# Patient Record
Sex: Male | Born: 2000 | Race: White | Hispanic: No | Marital: Single | Smoking: Never smoker
Health system: Southern US, Community
[De-identification: ages and names within clinical notes are randomized; demographics above are authoritative.]

## PROBLEM LIST (undated history)

## (undated) DIAGNOSIS — J302 Other seasonal allergic rhinitis: Secondary | ICD-10-CM

---
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
## 2022-01-03 DIAGNOSIS — J301 Allergic rhinitis due to pollen: Secondary | ICD-10-CM | POA: Diagnosis not present

## 2022-01-03 DIAGNOSIS — H1045 Other chronic allergic conjunctivitis: Secondary | ICD-10-CM | POA: Diagnosis not present

## 2022-01-03 DIAGNOSIS — J3081 Allergic rhinitis due to animal (cat) (dog) hair and dander: Secondary | ICD-10-CM | POA: Diagnosis not present

## 2022-01-03 DIAGNOSIS — J3089 Other allergic rhinitis: Secondary | ICD-10-CM | POA: Diagnosis not present

## 2022-06-14 DIAGNOSIS — M25612 Stiffness of left shoulder, not elsewhere classified: Secondary | ICD-10-CM | POA: Diagnosis not present

## 2022-06-14 DIAGNOSIS — M25611 Stiffness of right shoulder, not elsewhere classified: Secondary | ICD-10-CM | POA: Diagnosis not present

## 2022-06-14 DIAGNOSIS — M6281 Muscle weakness (generalized): Secondary | ICD-10-CM | POA: Diagnosis not present

## 2022-06-16 DIAGNOSIS — M6281 Muscle weakness (generalized): Secondary | ICD-10-CM | POA: Diagnosis not present

## 2022-06-16 DIAGNOSIS — M25611 Stiffness of right shoulder, not elsewhere classified: Secondary | ICD-10-CM | POA: Diagnosis not present

## 2022-06-16 DIAGNOSIS — M25612 Stiffness of left shoulder, not elsewhere classified: Secondary | ICD-10-CM | POA: Diagnosis not present

## 2022-06-20 DIAGNOSIS — M25611 Stiffness of right shoulder, not elsewhere classified: Secondary | ICD-10-CM | POA: Diagnosis not present

## 2022-06-20 DIAGNOSIS — M25612 Stiffness of left shoulder, not elsewhere classified: Secondary | ICD-10-CM | POA: Diagnosis not present

## 2022-06-20 DIAGNOSIS — M6281 Muscle weakness (generalized): Secondary | ICD-10-CM | POA: Diagnosis not present

## 2022-06-23 DIAGNOSIS — M6281 Muscle weakness (generalized): Secondary | ICD-10-CM | POA: Diagnosis not present

## 2022-06-23 DIAGNOSIS — M25611 Stiffness of right shoulder, not elsewhere classified: Secondary | ICD-10-CM | POA: Diagnosis not present

## 2022-06-23 DIAGNOSIS — M25612 Stiffness of left shoulder, not elsewhere classified: Secondary | ICD-10-CM | POA: Diagnosis not present

## 2022-06-26 DIAGNOSIS — M25612 Stiffness of left shoulder, not elsewhere classified: Secondary | ICD-10-CM | POA: Diagnosis not present

## 2022-06-26 DIAGNOSIS — M6281 Muscle weakness (generalized): Secondary | ICD-10-CM | POA: Diagnosis not present

## 2022-06-26 DIAGNOSIS — M25611 Stiffness of right shoulder, not elsewhere classified: Secondary | ICD-10-CM | POA: Diagnosis not present

## 2022-06-29 DIAGNOSIS — M25612 Stiffness of left shoulder, not elsewhere classified: Secondary | ICD-10-CM | POA: Diagnosis not present

## 2022-06-29 DIAGNOSIS — M25611 Stiffness of right shoulder, not elsewhere classified: Secondary | ICD-10-CM | POA: Diagnosis not present

## 2022-06-29 DIAGNOSIS — M6281 Muscle weakness (generalized): Secondary | ICD-10-CM | POA: Diagnosis not present

## 2022-07-03 DIAGNOSIS — M25612 Stiffness of left shoulder, not elsewhere classified: Secondary | ICD-10-CM | POA: Diagnosis not present

## 2022-07-03 DIAGNOSIS — M25611 Stiffness of right shoulder, not elsewhere classified: Secondary | ICD-10-CM | POA: Diagnosis not present

## 2022-07-03 DIAGNOSIS — M6281 Muscle weakness (generalized): Secondary | ICD-10-CM | POA: Diagnosis not present

## 2022-07-06 DIAGNOSIS — M25612 Stiffness of left shoulder, not elsewhere classified: Secondary | ICD-10-CM | POA: Diagnosis not present

## 2022-07-06 DIAGNOSIS — M6281 Muscle weakness (generalized): Secondary | ICD-10-CM | POA: Diagnosis not present

## 2022-07-06 DIAGNOSIS — M25611 Stiffness of right shoulder, not elsewhere classified: Secondary | ICD-10-CM | POA: Diagnosis not present

## 2022-07-10 DIAGNOSIS — M25612 Stiffness of left shoulder, not elsewhere classified: Secondary | ICD-10-CM | POA: Diagnosis not present

## 2022-07-10 DIAGNOSIS — M25611 Stiffness of right shoulder, not elsewhere classified: Secondary | ICD-10-CM | POA: Diagnosis not present

## 2022-07-10 DIAGNOSIS — M6281 Muscle weakness (generalized): Secondary | ICD-10-CM | POA: Diagnosis not present

## 2022-07-12 DIAGNOSIS — Z68.41 Body mass index (BMI) pediatric, 85th percentile to less than 95th percentile for age: Secondary | ICD-10-CM | POA: Diagnosis not present

## 2022-07-12 DIAGNOSIS — Z113 Encounter for screening for infections with a predominantly sexual mode of transmission: Secondary | ICD-10-CM | POA: Diagnosis not present

## 2022-07-12 DIAGNOSIS — Z1331 Encounter for screening for depression: Secondary | ICD-10-CM | POA: Diagnosis not present

## 2022-07-12 DIAGNOSIS — H6123 Impacted cerumen, bilateral: Secondary | ICD-10-CM | POA: Diagnosis not present

## 2022-07-12 DIAGNOSIS — Z713 Dietary counseling and surveillance: Secondary | ICD-10-CM | POA: Diagnosis not present

## 2022-07-12 DIAGNOSIS — Z0000 Encounter for general adult medical examination without abnormal findings: Secondary | ICD-10-CM | POA: Diagnosis not present

## 2022-07-12 DIAGNOSIS — Z0001 Encounter for general adult medical examination with abnormal findings: Secondary | ICD-10-CM | POA: Diagnosis not present

## 2022-07-14 DIAGNOSIS — M25612 Stiffness of left shoulder, not elsewhere classified: Secondary | ICD-10-CM | POA: Diagnosis not present

## 2022-07-14 DIAGNOSIS — M6281 Muscle weakness (generalized): Secondary | ICD-10-CM | POA: Diagnosis not present

## 2022-07-14 DIAGNOSIS — M25611 Stiffness of right shoulder, not elsewhere classified: Secondary | ICD-10-CM | POA: Diagnosis not present

## 2022-07-28 DIAGNOSIS — M25611 Stiffness of right shoulder, not elsewhere classified: Secondary | ICD-10-CM | POA: Diagnosis not present

## 2022-07-28 DIAGNOSIS — M6281 Muscle weakness (generalized): Secondary | ICD-10-CM | POA: Diagnosis not present

## 2022-07-28 DIAGNOSIS — M25612 Stiffness of left shoulder, not elsewhere classified: Secondary | ICD-10-CM | POA: Diagnosis not present

## 2022-08-01 DIAGNOSIS — M25611 Stiffness of right shoulder, not elsewhere classified: Secondary | ICD-10-CM | POA: Diagnosis not present

## 2022-08-01 DIAGNOSIS — M6281 Muscle weakness (generalized): Secondary | ICD-10-CM | POA: Diagnosis not present

## 2022-08-01 DIAGNOSIS — M25612 Stiffness of left shoulder, not elsewhere classified: Secondary | ICD-10-CM | POA: Diagnosis not present

## 2022-08-03 DIAGNOSIS — M25611 Stiffness of right shoulder, not elsewhere classified: Secondary | ICD-10-CM | POA: Diagnosis not present

## 2022-08-03 DIAGNOSIS — M6281 Muscle weakness (generalized): Secondary | ICD-10-CM | POA: Diagnosis not present

## 2022-08-03 DIAGNOSIS — M25612 Stiffness of left shoulder, not elsewhere classified: Secondary | ICD-10-CM | POA: Diagnosis not present

## 2022-08-07 DIAGNOSIS — M25611 Stiffness of right shoulder, not elsewhere classified: Secondary | ICD-10-CM | POA: Diagnosis not present

## 2022-08-07 DIAGNOSIS — M25612 Stiffness of left shoulder, not elsewhere classified: Secondary | ICD-10-CM | POA: Diagnosis not present

## 2022-08-07 DIAGNOSIS — M6281 Muscle weakness (generalized): Secondary | ICD-10-CM | POA: Diagnosis not present

## 2022-08-10 DIAGNOSIS — M25611 Stiffness of right shoulder, not elsewhere classified: Secondary | ICD-10-CM | POA: Diagnosis not present

## 2022-08-10 DIAGNOSIS — M25612 Stiffness of left shoulder, not elsewhere classified: Secondary | ICD-10-CM | POA: Diagnosis not present

## 2022-08-10 DIAGNOSIS — M6281 Muscle weakness (generalized): Secondary | ICD-10-CM | POA: Diagnosis not present

## 2022-08-14 DIAGNOSIS — M25612 Stiffness of left shoulder, not elsewhere classified: Secondary | ICD-10-CM | POA: Diagnosis not present

## 2022-08-14 DIAGNOSIS — M25611 Stiffness of right shoulder, not elsewhere classified: Secondary | ICD-10-CM | POA: Diagnosis not present

## 2022-08-14 DIAGNOSIS — M6281 Muscle weakness (generalized): Secondary | ICD-10-CM | POA: Diagnosis not present

## 2022-08-18 DIAGNOSIS — M6281 Muscle weakness (generalized): Secondary | ICD-10-CM | POA: Diagnosis not present

## 2022-08-18 DIAGNOSIS — M25612 Stiffness of left shoulder, not elsewhere classified: Secondary | ICD-10-CM | POA: Diagnosis not present

## 2022-08-18 DIAGNOSIS — M25611 Stiffness of right shoulder, not elsewhere classified: Secondary | ICD-10-CM | POA: Diagnosis not present

## 2022-08-21 DIAGNOSIS — M25612 Stiffness of left shoulder, not elsewhere classified: Secondary | ICD-10-CM | POA: Diagnosis not present

## 2022-08-21 DIAGNOSIS — M25611 Stiffness of right shoulder, not elsewhere classified: Secondary | ICD-10-CM | POA: Diagnosis not present

## 2022-08-21 DIAGNOSIS — M6281 Muscle weakness (generalized): Secondary | ICD-10-CM | POA: Diagnosis not present

## 2022-08-24 DIAGNOSIS — M6281 Muscle weakness (generalized): Secondary | ICD-10-CM | POA: Diagnosis not present

## 2022-08-24 DIAGNOSIS — M25612 Stiffness of left shoulder, not elsewhere classified: Secondary | ICD-10-CM | POA: Diagnosis not present

## 2022-08-24 DIAGNOSIS — M25611 Stiffness of right shoulder, not elsewhere classified: Secondary | ICD-10-CM | POA: Diagnosis not present

## 2023-01-03 DIAGNOSIS — J301 Allergic rhinitis due to pollen: Secondary | ICD-10-CM | POA: Diagnosis not present

## 2023-01-03 DIAGNOSIS — J3089 Other allergic rhinitis: Secondary | ICD-10-CM | POA: Diagnosis not present

## 2023-01-03 DIAGNOSIS — H1045 Other chronic allergic conjunctivitis: Secondary | ICD-10-CM | POA: Diagnosis not present

## 2023-01-03 DIAGNOSIS — J4520 Mild intermittent asthma, uncomplicated: Secondary | ICD-10-CM | POA: Diagnosis not present

## 2023-01-03 DIAGNOSIS — J3081 Allergic rhinitis due to animal (cat) (dog) hair and dander: Secondary | ICD-10-CM | POA: Diagnosis not present

## 2023-04-21 DIAGNOSIS — S93601A Unspecified sprain of right foot, initial encounter: Secondary | ICD-10-CM | POA: Diagnosis not present

## 2023-07-31 ENCOUNTER — Encounter (HOSPITAL_BASED_OUTPATIENT_CLINIC_OR_DEPARTMENT_OTHER): Payer: Self-pay

## 2023-07-31 ENCOUNTER — Emergency Department (HOSPITAL_BASED_OUTPATIENT_CLINIC_OR_DEPARTMENT_OTHER): Payer: BC Managed Care – PPO

## 2023-07-31 ENCOUNTER — Other Ambulatory Visit: Payer: Self-pay

## 2023-07-31 ENCOUNTER — Emergency Department (HOSPITAL_BASED_OUTPATIENT_CLINIC_OR_DEPARTMENT_OTHER)
Admission: EM | Admit: 2023-07-31 | Discharge: 2023-07-31 | Disposition: A | Discharge status: Home / Self Care | Payer: BC Managed Care – PPO | Attending: Emergency Medicine | Admitting: Emergency Medicine

## 2023-07-31 DIAGNOSIS — R072 Precordial pain: Secondary | ICD-10-CM | POA: Diagnosis not present

## 2023-07-31 DIAGNOSIS — R079 Chest pain, unspecified: Secondary | ICD-10-CM | POA: Insufficient documentation

## 2023-07-31 DIAGNOSIS — R519 Headache, unspecified: Secondary | ICD-10-CM | POA: Diagnosis not present

## 2023-07-31 DIAGNOSIS — M25512 Pain in left shoulder: Secondary | ICD-10-CM | POA: Diagnosis not present

## 2023-07-31 DIAGNOSIS — R0789 Other chest pain: Secondary | ICD-10-CM | POA: Diagnosis not present

## 2023-07-31 DIAGNOSIS — R0989 Other specified symptoms and signs involving the circulatory and respiratory systems: Secondary | ICD-10-CM | POA: Diagnosis not present

## 2023-07-31 MED ORDER — IBUPROFEN 800 MG PO TABS: 800.0000 mg | ORAL_TABLET | Freq: Once | ORAL | Status: DC

## 2023-07-31 MED ORDER — IBUPROFEN 400 MG PO TABS
400.0000 mg | ORAL_TABLET | Freq: Once | ORAL | Status: AC
  Administered 2023-07-31: 400 mg via ORAL
  Filled 2023-07-31: qty 1

## 2023-07-31 NOTE — ED Triage Notes (Signed)
POV from home, A&O x4, GCS 15, amb to room  Woke from sleep with left shoulder and midsternal chest pain, c/o headache and nausea.

## 2023-07-31 NOTE — Discharge Instructions (Signed)
Take 4 over the counter ibuprofen tablets 3 times a day or 2 over-the-counter naproxen tablets twice a day for pain. Also take tylenol 1000mg (2 extra strength) four times a day.    Please return for worsening pain if you pass out or if you start coughing up blood.

## 2023-07-31 NOTE — ED Provider Notes (Signed)
[place] EMERGENCY DEPARTMENT AT [facility] Provider Note   CSN: [id number] Arrival date & time: 07/31/23  9629     History  Chief Complaint  Patient presents with   Chest Pain    Howard Walker is a 22 y.o. male.  22 yo M with a cc of chest pain.  Woke up from sleep initially with left shoulder pain, now feels like its in his chest and neck.  Hard to describe it, nothing seems to make it better.  Is getting slowly worse. Denies pain earlier today.    Seasonal allergies.  Denies cough congestion.  Denies trauma.    No family hx of MI.  He denies history of PE or DVT denies hemoptysis denies history of cancer denies testosterone use denies recent surgery or immobilization.   Chest Pain      Home Medications Prior to Admission medications   Not on File      Allergies    Patient has no known allergies.    Review of Systems   Review of Systems  Cardiovascular:  Positive for chest pain.    Physical Exam Updated Vital Signs BP (!) 142/90   Pulse 71   Temp (P) 97.8 F (36.6 C)   Resp 15   Ht (P) 6\' 7"  (2.007 m)   SpO2 97%  Physical Exam Vitals and nursing note reviewed.  Constitutional:      Appearance: He is well-developed.  HENT:     Head: Normocephalic and atraumatic.  Eyes:     Pupils: Pupils are equal, round, and reactive to light.  Neck:     Vascular: No JVD.  Cardiovascular:     Rate and Rhythm: Normal rate and regular rhythm.     Heart sounds: No murmur heard.    No friction rub. No gallop.  Pulmonary:     Effort: No respiratory distress.     Breath sounds: No wheezing.  Chest:     Chest wall: Tenderness present.     Comments: Some pain on palpation along the left chest wall into the shoulder. Abdominal:     General: There is no distension.     Tenderness: There is no abdominal tenderness. There is no guarding or rebound.  Musculoskeletal:        General: Normal range of motion.     Cervical back: Normal range of motion and  neck supple.  Skin:    Coloration: Skin is not pale.     Findings: No rash.  Neurological:     Mental Status: He is alert and oriented to person, place, and time.  Psychiatric:        Behavior: Behavior normal.     ED Results / Procedures / Treatments   Labs (all labs ordered are listed, but only abnormal results are displayed) Labs Reviewed - No data to display  EKG EKG Interpretation Date/Time:  Tuesday July 31 2023 03:31:27 EDT Ventricular Rate:  72 PR Interval:  203 QRS Duration:  87 QT Interval:  403 QTC Calculation: 441 R Axis:   71  Text Interpretation: Sinus rhythm Borderline prolonged PR interval No old tracing to compare Confirmed by Melene Plan [phone]) on 07/31/2023 3:44:24 AM  Radiology DG Chest Port 1 View  Result Date: 07/31/2023 CLINICAL DATA:  22 year old male with midsternal chest pain, headache, nausea, left shoulder pain. EXAM: PORTABLE CHEST 1 VIEW COMPARISON:  Chest radiographs 01/18/2009. FINDINGS: Portable AP view at 0359 hours. Lower lung volumes. Normal cardiac size and mediastinal contours. Visualized  tracheal air column is within normal limits. Allowing for portable technique the lungs are clear. No pneumothorax or pleural effusion. Negative visible gas in the stomach fundus. No osseous abnormality identified. IMPRESSION: Low lung volumes.  No acute cardiopulmonary abnormality. Electronically Signed   By: Odessa Fleming M.D.   On: 07/31/2023 04:36    Procedures Procedures    Medications Ordered in ED Medications  ibuprofen (ADVIL) tablet 400 mg (400 mg Oral Given 07/31/23 0347)    ED Course/ Medical Decision Making/ A&P                                 Medical Decision Making Amount and/or Complexity of Data Reviewed Radiology: ordered.  Risk Prescription drug management.   22 yo M with a chief complaints of sudden onset left-sided chest discomfort.  Woke him up from sleep.  Initially was in the shoulder and then moved to the chest.  Nothing seems  to make it better or worse.  He denies preceding illness.  He is PERC negative.  EKG is nonischemic.  Will obtain a chest x-ray.  Chest x-ray independently interpreted by me without focal infiltrate or pneumothorax.  I discussed results with the patient and family.  Will have them follow-up with their family doctor.  4:57 AM:  I have discussed the diagnosis/risks/treatment options with the patient and family.  Evaluation and diagnostic testing in the emergency department does not suggest an emergent condition requiring admission or immediate intervention beyond what has been performed at this time.  They will follow up with PCP. We also discussed returning to the ED immediately if new or worsening sx occur. We discussed the sx which are most concerning (e.g., sudden worsening pain, fever, inability to tolerate by mouth) that necessitate immediate return. Medications administered to the patient during their visit and any new prescriptions provided to the patient are listed below.  Medications given during this visit Medications  ibuprofen (ADVIL) tablet 400 mg (400 mg Oral Given 07/31/23 0347)     The patient appears reasonably screen and/or stabilized for discharge and I doubt any other medical condition or other [facility] requiring further screening, evaluation, or treatment in the ED at this time prior to discharge.          Final Clinical Impression(s) / ED Diagnoses Final diagnoses:  Nonspecific chest pain    Rx / DC Orders ED Discharge Orders     None         Melene Plan, DO 07/31/23 [phone]

## 2023-08-01 ENCOUNTER — Other Ambulatory Visit (HOSPITAL_COMMUNITY): Payer: Self-pay | Admitting: *Deleted

## 2023-08-01 DIAGNOSIS — R079 Chest pain, unspecified: Secondary | ICD-10-CM | POA: Diagnosis not present

## 2023-08-01 DIAGNOSIS — R000 Tachycardia, unspecified: Secondary | ICD-10-CM | POA: Diagnosis not present

## 2023-08-01 DIAGNOSIS — R0602 Shortness of breath: Secondary | ICD-10-CM | POA: Diagnosis not present

## 2023-08-01 DIAGNOSIS — R011 Cardiac murmur, unspecified: Secondary | ICD-10-CM | POA: Diagnosis not present

## 2023-08-01 NOTE — Progress Notes (Signed)
Echo order placed per Dr Gala Romney, will schedule

## 2023-08-02 ENCOUNTER — Ambulatory Visit (HOSPITAL_BASED_OUTPATIENT_CLINIC_OR_DEPARTMENT_OTHER)
Admission: RE | Admit: 2023-08-02 | Discharge: 2023-08-02 | Disposition: A | Discharge status: Home / Self Care | Payer: BC Managed Care – PPO | Source: Ambulatory Visit | Attending: Internal Medicine | Admitting: Internal Medicine

## 2023-08-02 ENCOUNTER — Encounter (HOSPITAL_COMMUNITY): Payer: Self-pay | Admitting: Internal Medicine

## 2023-08-02 ENCOUNTER — Ambulatory Visit (HOSPITAL_COMMUNITY)
Admission: RE | Admit: 2023-08-02 | Discharge: 2023-08-02 | Disposition: A | Discharge status: Home / Self Care | Payer: BC Managed Care – PPO | Source: Ambulatory Visit | Attending: Internal Medicine | Admitting: Internal Medicine

## 2023-08-02 VITALS — BP 138/80 | HR 73 | Wt 243.6 lb

## 2023-08-02 DIAGNOSIS — Z8249 Family history of ischemic heart disease and other diseases of the circulatory system: Secondary | ICD-10-CM | POA: Diagnosis not present

## 2023-08-02 DIAGNOSIS — R079 Chest pain, unspecified: Secondary | ICD-10-CM

## 2023-08-02 DIAGNOSIS — R0602 Shortness of breath: Secondary | ICD-10-CM | POA: Diagnosis not present

## 2023-08-02 DIAGNOSIS — I10 Essential (primary) hypertension: Secondary | ICD-10-CM | POA: Insufficient documentation

## 2023-08-02 DIAGNOSIS — I3139 Other pericardial effusion (noninflammatory): Secondary | ICD-10-CM | POA: Insufficient documentation

## 2023-08-02 DIAGNOSIS — M25512 Pain in left shoulder: Secondary | ICD-10-CM | POA: Diagnosis not present

## 2023-08-02 DIAGNOSIS — R0781 Pleurodynia: Secondary | ICD-10-CM | POA: Diagnosis not present

## 2023-08-02 LAB — TROPONIN I (HIGH SENSITIVITY): Troponin I (High Sensitivity): <2 ng/L (ref <18)

## 2023-08-02 LAB — ECHOCARDIOGRAM COMPLETE
Area-P 1/2: 3.65 cm2
Calc EF: 65.2 %
S' Lateral: 2.9 cm
Single Plane A2C EF: 67.7 %
Single Plane A4C EF: 62.6 %

## 2023-08-02 LAB — COMPREHENSIVE METABOLIC PANEL
ALT: 18 U/L (ref 0–44)
AST: 17 U/L (ref 15–41)
Albumin: 4.1 g/dL (ref 3.5–5.0)
Alkaline Phosphatase: 72 U/L (ref 38–126)
Anion gap: 9 (ref 5–15)
BUN: 12 mg/dL (ref 6–20)
CO2: 24 mmol/L (ref 22–32)
Calcium: 9.1 mg/dL (ref 8.9–10.3)
Chloride: 103 mmol/L (ref 98–111)
Creatinine, Ser: 0.95 mg/dL (ref 0.61–1.24)
GFR, Estimated: >60 mL/min (ref >60)
Glucose, Bld: 118 mg/dL — ABNORMAL HIGH (ref 70–99)
Potassium: 4.2 mmol/L (ref 3.5–5.1)
Sodium: 136 mmol/L (ref 135–145)
Total Bilirubin: 0.4 mg/dL (ref 0.3–1.2)
Total Protein: 7.4 g/dL (ref 6.5–8.1)

## 2023-08-02 LAB — C-REACTIVE PROTEIN: CRP: 3.1 mg/dL — ABNORMAL HIGH (ref <1.0)

## 2023-08-02 LAB — CBC
HCT: 46.3 % (ref 39.0–52.0)
Hemoglobin: 15 g/dL (ref 13.0–17.0)
MCH: 28.2 pg (ref 26.0–34.0)
MCHC: 32.4 g/dL (ref 30.0–36.0)
MCV: 87.2 fL (ref 80.0–100.0)
Platelets: 311 10*3/uL (ref 150–400)
RBC: 5.31 MIL/uL (ref 4.22–5.81)
RDW: 12.4 % (ref 11.5–15.5)
WBC: 6.8 10*3/uL (ref 4.0–10.5)
nRBC: 0 % (ref 0.0–0.2)

## 2023-08-02 LAB — SEDIMENTATION RATE: Sed Rate: 4 mm/hr (ref 0–16)

## 2023-08-02 LAB — D-DIMER, QUANTITATIVE: D-Dimer, Quant: <0.27 ug/mL-FEU (ref 0.00–0.50)

## 2023-08-02 MED ORDER — IOHEXOL 350 MG/ML SOLN
75.0000 mL | Freq: Once | INTRAVENOUS | Status: AC | PRN
  Administered 2023-08-02: 75 mL via INTRAVENOUS

## 2023-08-02 NOTE — Progress Notes (Signed)
CARDIOLOGY CLINIC CONSULT NOTE  Referring; Dr. Raquel Sarna, MD  HPI:  [name] is a 22 y/o male with seasonal allergies. Referred for further evaluation of chest pain.   Very active in past. Played all types of sports without problem.   Has been doing well. No recent illnesses or injuries. Flew back from [place] on July 8 but no travel since.   Seen in ED 8/6 with CP. Says he woke from sleep at 2a with severe left shoulder pain took ibuprofen and tylenol. Then pain radiated into chest and jaw. + SOB. CXR and ECG ok. No labs drawn. No f/c/nausea, leg swelling. No cough.  Shoulder is fine now. Says he gets CP with sitting up or exerting himself. Notes his HR goes up more quickly than unusual. Feels like the pain is getting better.   Underwent echo today EF 60-65% No RWMA. N pericardial effusion. RV ok.   M: HTN F: Healthy  MGM: CAD with MI 60   Review of Systems:     Cardiac Review of Systems: {Y] = yes [ ]  = no  Chest Pain [place] ]  Resting SOB [   ] Exertional SOB  [  ]  Orthopnea [  ]   Pedal Edema [   ]    Palpitations [  ] Syncope  [  ]   Presyncope [   ]  General Review of Systems: [Y] = yes [  ]=no Constitional: recent weight change [  ]; anorexia [  ]; fatigue [  ]; nausea [  ]; night sweats [  ]; fever [  ]; or chills [ ] ;                                                                                                                                           Eye : blurred vision [  ]; diplopia [   ]; vision changes [  ];  Amaurosis fugax[  ]; Resp: cough [  ];  wheezing[  ];  hemoptysis[  ]; shortness of breath[  ]; paroxysmal nocturnal dyspnea[  ]; dyspnea on exertion[  ]; or orthopnea[  ];  GI:  gallstones[  ], vomiting[  ];  dysphagia[  ]; melena[  ];  hematochezia [  ]; heartburn[  ];   Hx of  Colonoscopy[  ]; GU: kidney stones [  ]; hematuria[  ];   dysuria [  ];  nocturia[  ];  history of     obstruction [  ];                 Skin: rash, swelling[  ];, hair loss[  ];   peripheral edema[  ];  or itching[  ]; Musculosketetal: myalgias[  ];  joint swelling[  ];  joint erythema[  ];  joint pain[  ];  back pain[  ];  Heme/Lymph: bruising[  ];  bleeding[  ];  anemia[  ];  Neuro: TIA[  ];  headaches[  ];  stroke[  ];  vertigo[  ];  seizures[  ];   paresthesias[  ];  difficulty walking[  ];  Psych:depression[  ]; anxiety[  ];  Endocrine: diabetes[  ];  thyroid dysfunction[  ];  Immunizations: Flu [  ]; Pneumococcal[  ];  Other:    Past Medical History:  Diagnosis Date   Seasonal allergies     Current Outpatient Medications  Medication Sig Dispense Refill   levocetirizine (XYZAL) 5 MG tablet Take 5 mg by mouth every evening.     montelukast (SINGULAIR) 10 MG tablet Take 10 mg by mouth daily.     OLOPATADINE-MOMETASONE NA Place 1 spray into the nose as needed.     No current facility-administered medications for this encounter.     No Known Allergies  Social History   Socioeconomic History   Marital status: Single    Spouse name: Not on file   Number of children: Not on file   Years of education: Not on file   Highest education level: Not on file  Occupational History   Not on file  Tobacco Use   Smoking status: Never   Smokeless tobacco: Never  Substance and Sexual Activity   Alcohol use: Not on file   Drug use: Not on file   Sexual activity: Not on file  Other Topics Concern   Not on file  Social History Narrative   Not on file   Social Determinants of Health   Financial Resource Strain: Not on file  Food Insecurity: Not on file  Transportation Needs: Not on file  Physical Activity: Not on file  Stress: Not on file  Social Connections: Not on file  Intimate Partner Violence: Not on file    History reviewed. No pertinent family history.  PHYSICAL EXAM: Vitals:   08/02/23 1434  BP: 138/80  Pulse: 73  SpO2: 96%   General:  Well appearing. No respiratory difficulty HEENT: normal Neck: supple. no JVD. Carotids 2+ bilat; no  bruits. No lymphadenopathy or thryomegaly appreciated. Cor: PMI nondisplaced. Regular rate & rhythm. No rubs, gallops or murmurs. Lungs: clear Abdomen: soft, nontender, nondistended. No hepatosplenomegaly. No bruits or masses. Good bowel sounds. Extremities: no cyanosis, clubbing, rash, edema Neuro: alert & oriented x 3, cranial nerves grossly intact. moves all 4 extremities w/o difficulty. Affect pleasant.  ECG: NSR 72. Early repol Personally reviewed  Hall walk: Sats 97% HR 80-90s Personally reviewed    No results found for this or any previous visit (from the past 24 hour(s)). ECHOCARDIOGRAM COMPLETE  Result Date: 08/02/2023    ECHOCARDIOGRAM REPORT   Patient Name:   Howard Walker Date of Exam: 08/02/2023 Medical Rec #:  [id number]       Height: Accession #:    [id number]      Weight:       74.1 lb Date of Birth:  Aug 21, 2001        BSA:          1.041 m Patient Age:    22 years        BP:           142/90 mmHg Patient Gender: M               HR:           72 bpm. Exam Location:  Outpatient Procedure: 2D Echo, Cardiac Doppler, Color Doppler and Strain Analysis Indications:  R07.9* Chest pain, unspecified  History:        Patient has no prior history of Echocardiogram examinations.                 Signs/Symptoms:Chest Pain.  Sonographer:    Eulah Pont RDCS Referring Phys: [id number] Jayke Caul R Yafet Cline  Sonographer Comments: Global longitudinal strain was attempted. IMPRESSIONS  1. Left ventricular ejection fraction, by estimation, is 60 to 65%. The left ventricle has normal function. The left ventricle has no regional wall motion abnormalities. Left ventricular diastolic parameters were normal. The average left ventricular global longitudinal strain is -21.0 %. The global longitudinal strain is normal.  2. Right ventricular systolic function is normal. The right ventricular size is normal. Tricuspid regurgitation signal is inadequate for assessing PA pressure.  3. The mitral valve is normal in  structure. Trivial mitral valve regurgitation. No evidence of mitral stenosis.  4. The aortic valve is tricuspid. Aortic valve regurgitation is not visualized. No aortic stenosis is present.  5. The inferior vena cava is normal in size with greater than 50% respiratory variability, suggesting right atrial pressure of 3 mmHg. Comparison(s): No prior Echocardiogram. FINDINGS  Left Ventricle: Left ventricular ejection fraction, by estimation, is 60 to 65%. The left ventricle has normal function. The left ventricle has no regional wall motion abnormalities. The average left ventricular global longitudinal strain is -21.0 %. The global longitudinal strain is normal. The left ventricular internal cavity size was normal in size. There is no left ventricular hypertrophy. Left ventricular diastolic parameters were normal. Right Ventricle: The right ventricular size is normal. Right ventricular systolic function is normal. Tricuspid regurgitation signal is inadequate for assessing PA pressure. The tricuspid regurgitant velocity is 1.99 m/s, and with an assumed right atrial  pressure of 3 mmHg, the estimated right ventricular systolic pressure is 18.8 mmHg. Left Atrium: Left atrial size was normal in size. Right Atrium: Right atrial size was normal in size. Pericardium: There is no evidence of pericardial effusion. Mitral Valve: The mitral valve is normal in structure. Trivial mitral valve regurgitation. No evidence of mitral valve stenosis. Tricuspid Valve: The tricuspid valve is normal in structure. Tricuspid valve regurgitation is trivial. No evidence of tricuspid stenosis. Aortic Valve: The aortic valve is tricuspid. Aortic valve regurgitation is not visualized. No aortic stenosis is present. Pulmonic Valve: The pulmonic valve was normal in structure. Pulmonic valve regurgitation is trivial. No evidence of pulmonic stenosis. Aorta: The aortic root is normal in size and structure. Venous: The inferior vena cava is normal in  size with greater than 50% respiratory variability, suggesting right atrial pressure of 3 mmHg. IAS/Shunts: No atrial level shunt detected by color flow Doppler.  LEFT VENTRICLE PLAX 2D LVIDd:         4.70 cm      Diastology LVIDs:         2.90 cm      LV e' medial:    13.90 cm/s LV PW:         0.90 cm      LV E/e' medial:  7.2 LV IVS:        0.80 cm      LV e' lateral:   17.00 cm/s LVOT diam:     2.10 cm      LV E/e' lateral: 5.9 LV SV:         70 LV SV Index:   67           2D Longitudinal Strain LVOT Area:  3.46 cm     2D Strain GLS Avg:     -21.0 %  LV Volumes (MOD) LV vol d, MOD A2C: 152.0 ml LV vol d, MOD A4C: 125.0 ml LV vol s, MOD A2C: 49.1 ml LV vol s, MOD A4C: 46.7 ml LV SV MOD A2C:     102.9 ml LV SV MOD A4C:     125.0 ml LV SV MOD BP:      90.1 ml RIGHT VENTRICLE RV S prime:     15.60 cm/s TAPSE (M-mode): 3.1 cm LEFT ATRIUM             Index        RIGHT ATRIUM           Index LA diam:        3.40 cm 3.26 cm/m   RA Area:     18.50 cm LA Vol (A2C):   51.3 ml 49.26 ml/m  RA Volume:   56.70 ml  54.45 ml/m LA Vol (A4C):   32.4 ml 31.11 ml/m LA Biplane Vol: 41.6 ml 39.95 ml/m  AORTIC VALVE LVOT Vmax:   119.00 cm/s LVOT Vmean:  73.900 cm/s LVOT VTI:    0.202 m  AORTA Ao Root diam: 3.00 cm Ao Asc diam:  2.80 cm MITRAL VALVE                TRICUSPID VALVE MV Area (PHT): 3.65 cm     TR Peak grad:   15.8 mmHg MV Decel Time: 208 msec     TR Vmax:        199.00 cm/s MV E velocity: 100.00 cm/s MV A velocity: 51.90 cm/s   SHUNTS MV E/A ratio:  1.93         Systemic VTI:  0.20 m                             Systemic Diam: 2.10 cm Olga Millers MD Electronically signed by Olga Millers MD Signature Date/Time: 08/02/2023/2:43:51 PM    Final      ASSESSMENT & PLAN:  1. Chest pain - appears mostly pleuritic in nature but no preceding viral syndrome to explain - echo today LVEF 60-65% no RWMA. RV ok - given recent travel and severity of symptoms will proceed with CT angio of chest - check labs -  ibuprofen 800 2-3x/day for 3-5 days until symptoms abate  Arvilla Meres, MD  4:27 PM      Arvilla Meres, MD  3:12 PM

## 2023-08-02 NOTE — Patient Instructions (Addendum)
Great to see you today!!!  Labs done today, your results will be available in MyChart, we will contact you for abnormal readings.  Non-Cardiac CT Angiography (CTA), is a special type of CT scan that uses a computer to produce multi-dimensional views of major blood vessels throughout the body. In CT angiography, a contrast material is injected through an IV to help visualize the blood vessels  Follow up AS NEEDED  If you have any questions or concerns before your next appointment please send [place] a message through [place] or call our office at [phone].    TO LEAVE A MESSAGE FOR THE NURSE SELECT OPTION 2, PLEASE LEAVE A MESSAGE INCLUDING: YOUR NAME DATE OF BIRTH CALL BACK NUMBER REASON FOR CALL**this is important as we prioritize the call backs  YOU WILL RECEIVE A CALL BACK THE SAME DAY AS LONG AS YOU CALL BEFORE 4:00 PM

## 2023-08-02 NOTE — Progress Notes (Signed)
2D Echocardiogram has been performed.  Howard Walker 08/02/2023, 2:42 PM

## 2023-08-21 DIAGNOSIS — H6121 Impacted cerumen, right ear: Secondary | ICD-10-CM | POA: Diagnosis not present

## 2023-08-21 DIAGNOSIS — Z0000 Encounter for general adult medical examination without abnormal findings: Secondary | ICD-10-CM | POA: Diagnosis not present
